# Patient Record
Sex: Female | Born: 1985 | Race: White | Hispanic: No | Marital: Single | Smoking: Current some day smoker
Health system: Southern US, Community
[De-identification: ages and names within clinical notes are randomized; demographics above are authoritative.]

## PROBLEM LIST (undated history)

## (undated) DIAGNOSIS — R87629 Unspecified abnormal cytological findings in specimens from vagina: Secondary | ICD-10-CM

## (undated) DIAGNOSIS — N96 Recurrent pregnancy loss: Secondary | ICD-10-CM

## (undated) HISTORY — PX: CHOLECYSTECTOMY: SHX55

---

## 2007-06-30 ENCOUNTER — Ambulatory Visit (HOSPITAL_COMMUNITY): Admission: RE | Admit: 2007-06-30 | Discharge: 2007-06-30 | Payer: Self-pay | Admitting: Obstetrics and Gynecology

## 2015-04-22 ENCOUNTER — Emergency Department (HOSPITAL_COMMUNITY)
Admission: EM | Admit: 2015-04-22 | Discharge: 2015-04-22 | Disposition: A | Discharge status: Home / Self Care | Payer: Medicaid Other | Attending: Emergency Medicine | Admitting: Emergency Medicine

## 2015-04-22 ENCOUNTER — Encounter (HOSPITAL_COMMUNITY): Payer: Self-pay

## 2015-04-22 DIAGNOSIS — M791 Myalgia: Secondary | ICD-10-CM | POA: Diagnosis not present

## 2015-04-22 DIAGNOSIS — H9209 Otalgia, unspecified ear: Secondary | ICD-10-CM | POA: Insufficient documentation

## 2015-04-22 DIAGNOSIS — Z87891 Personal history of nicotine dependence: Secondary | ICD-10-CM | POA: Diagnosis not present

## 2015-04-22 DIAGNOSIS — R197 Diarrhea, unspecified: Secondary | ICD-10-CM | POA: Insufficient documentation

## 2015-04-22 DIAGNOSIS — Z880 Allergy status to penicillin: Secondary | ICD-10-CM | POA: Diagnosis not present

## 2015-04-22 DIAGNOSIS — J069 Acute upper respiratory infection, unspecified: Secondary | ICD-10-CM | POA: Diagnosis not present

## 2015-04-22 DIAGNOSIS — R05 Cough: Secondary | ICD-10-CM | POA: Diagnosis present

## 2015-04-22 LAB — RAPID STREP SCREEN (MED CTR MEBANE ONLY): STREPTOCOCCUS, GROUP A SCREEN (DIRECT): NEGATIVE

## 2015-04-22 MED ORDER — HYDROCOD POLST-CPM POLST ER 10-8 MG/5ML PO SUER: 5.0000 mL | Freq: Two times a day (BID) | ORAL | Status: DC | PRN

## 2015-04-22 NOTE — ED Notes (Signed)
Pt seen and evaluated by EDNP for initial assessment, RN in room.

## 2015-04-22 NOTE — ED Notes (Signed)
Fever for 3 days. I have a sore throat, congestion, and my ears hurt. Diarrhea today. Vomited yesterday. Was seen in there ER at [place] last night and diagnosed with an upper respiratory infection.

## 2015-04-22 NOTE — Discharge Instructions (Signed)
Your strep screen is negative. We are treating your cough and congestion. Do not take the cough medication if you are driving as it will make you sleepy. You may continue to take the over the counter medication for congestion.

## 2015-04-22 NOTE — ED Provider Notes (Signed)
CSN: [id number]     Arrival date & time 04/22/15  2014 History   First MD Initiated Contact with Patient 04/22/15 2026     Chief Complaint  Patient presents with  . URI     (Consider location/radiation/quality/duration/timing/severity/associated sxs/prior Treatment) Patient is a 29 y.o. female presenting with URI. The history is provided by the patient.  URI Presenting symptoms: congestion, cough, ear pain, fever and sore throat   Severity:  Moderate Onset quality:  Gradual Duration:  3 days Timing:  Constant Progression:  Worsening Chronicity:  New Relieved by:  Nothing Worsened by:  Nothing tried Ineffective treatments:  OTC medications Associated symptoms: headaches, myalgias and swollen glands    Cathy Peters is a 29 y.o. female who presents to the ED with fever, sore throat, congestion and ear pain x 3 days. She reports going to [facility] ED in [place] last night and diagnosed with URI. She had a chest x-ray that was normal and prescribed Tessalon for cough. Patient states not helping. Today she has coughed until she vomits. She also reports loose stools x 3.  History reviewed. No pertinent past medical history. Past Surgical History  Procedure Laterality Date  . Cholecystectomy     No family history on file. Social History  Substance Use Topics  . Smoking status: Former Games developer  . Smokeless tobacco: None  . Alcohol Use: No   OB History    No data available     Review of Systems  Constitutional: Positive for fever.  HENT: Positive for congestion, ear pain and sore throat.   Respiratory: Positive for cough.   Gastrointestinal: Vomiting: with cough.  Musculoskeletal: Positive for myalgias.  Neurological: Positive for headaches.  all other systems negative    Allergies  Penicillins  Home Medications   Prior to Admission medications   Not on File   BP 117/48 mmHg  Pulse 87  Temp(Src) 98.4 F (36.9 C) (Oral)  Resp 20  Ht  (1.651 m)  Wt 155  lb (70.308 kg)  BMI 25.79 kg/m2  SpO2 100% Physical Exam  Constitutional: She is oriented to person, place, and time. She appears well-developed and well-nourished.  HENT:  Head: Normocephalic and atraumatic.  Right Ear: Tympanic membrane normal.  Left Ear: Tympanic membrane normal.  Nose: Rhinorrhea present.  Mouth/Throat: Uvula is midline and mucous membranes are normal. Posterior oropharyngeal erythema present.  Eyes: Conjunctivae and EOM are normal. Pupils are equal, round, and reactive to light.  Neck: Neck supple.  Cardiovascular: Normal rate and regular rhythm.   Pulmonary/Chest: Effort normal and breath sounds normal. No respiratory distress.  Abdominal: Soft. There is no tenderness.  Musculoskeletal: Normal range of motion.  Neurological: She is alert and oriented to person, place, and time. No cranial nerve deficit.  Skin: Skin is warm and dry.  Psychiatric: She has a normal mood and affect. Her behavior is normal.  Nursing note and vitals reviewed.   ED Course  Procedures   MDM  29 y.o. female with cough, congestion, sore throat and aching all over and diarrhea today. She will continue to take her congestion medication and I will add Tussionex for the cough. She will follow up with her PCP. Discussed with the patient clinical and lab findings and plan of care. All questioned fully answered. She will return if any problems arise.stable for d/c without fever and does not appear toxic.    Final diagnoses:  URI (upper respiratory infection)  Diarrhea, unspecified type  [place], [place] 04/22/15 2125  Marily Memos, MD 04/23/15 [phone]

## 2015-04-26 LAB — CULTURE, GROUP A STREP: STREP A CULTURE: NEGATIVE

## 2017-11-15 ENCOUNTER — Other Ambulatory Visit (HOSPITAL_COMMUNITY): Payer: Self-pay | Admitting: Nurse Practitioner

## 2017-11-15 DIAGNOSIS — O289 Unspecified abnormal findings on antenatal screening of mother: Secondary | ICD-10-CM

## 2017-12-01 ENCOUNTER — Encounter (HOSPITAL_COMMUNITY): Payer: Self-pay

## 2017-12-01 ENCOUNTER — Ambulatory Visit (HOSPITAL_COMMUNITY): Payer: Medicaid Other

## 2017-12-13 ENCOUNTER — Encounter (HOSPITAL_COMMUNITY): Payer: Self-pay | Admitting: *Deleted

## 2017-12-15 ENCOUNTER — Other Ambulatory Visit (HOSPITAL_COMMUNITY): Payer: Self-pay | Admitting: Nurse Practitioner

## 2017-12-15 ENCOUNTER — Ambulatory Visit (HOSPITAL_COMMUNITY)
Admission: RE | Admit: 2017-12-15 | Discharge: 2017-12-15 | Disposition: A | Discharge status: Home / Self Care | Payer: Medicaid Other | Source: Ambulatory Visit | Attending: Nurse Practitioner | Admitting: Nurse Practitioner

## 2017-12-15 ENCOUNTER — Encounter (HOSPITAL_COMMUNITY): Payer: Self-pay

## 2017-12-15 ENCOUNTER — Other Ambulatory Visit: Payer: Self-pay

## 2017-12-15 DIAGNOSIS — O280 Abnormal hematological finding on antenatal screening of mother: Secondary | ICD-10-CM | POA: Insufficient documentation

## 2017-12-15 DIAGNOSIS — Z3686 Encounter for antenatal screening for cervical length: Secondary | ICD-10-CM

## 2017-12-15 DIAGNOSIS — Z3A22 22 weeks gestation of pregnancy: Secondary | ICD-10-CM | POA: Diagnosis not present

## 2017-12-15 DIAGNOSIS — O09212 Supervision of pregnancy with history of pre-term labor, second trimester: Secondary | ICD-10-CM

## 2017-12-15 DIAGNOSIS — Z363 Encounter for antenatal screening for malformations: Secondary | ICD-10-CM

## 2017-12-15 DIAGNOSIS — O289 Unspecified abnormal findings on antenatal screening of mother: Secondary | ICD-10-CM

## 2017-12-15 DIAGNOSIS — Z368A Encounter for antenatal screening for other genetic defects: Secondary | ICD-10-CM | POA: Insufficient documentation

## 2017-12-15 HISTORY — DX: Recurrent pregnancy loss: N96

## 2017-12-15 HISTORY — DX: Unspecified abnormal cytological findings in specimens from vagina: R87.629

## 2017-12-15 NOTE — Consult Note (Signed)
Maternal Fetal Medicine Consultation  Requesting Provider(s): Arlyn Leak  Primary OB: Arlyn Leak Reason for consultation: 1. Elevated MSAFP at 2.55 MoM 2. History of PTD  HPI: 32 yo P0202 at 22+0 weeks with elevated MSAFP as noted above. She has undergone [place] and there is no evidence of open neural tube defect or ventral wall defect. She has undergone genetic counseling and does not wish to undergo amniocentesis for acetylcholinesterase testing to rule out very small open NTD. She has had 2 previous PTD; the first was a spontaneous PTD at 32 weeks in 2008, baby weight 1814g. The second was possible a 36 week PTD but may have been term, in 2009 She was on 17-hydroxyprogesterone during that pregnancy and the baby weight 2722g. She is currently on 17-hydroxyprogesterone weekly. She reports no PTL symptoms  OB History: OB History    Gravida  6   Para  2   Term  0   Preterm  2   AB  3   Living  2     SAB  3   TAB      Ectopic      Multiple      Live Births              PMH:  Past Medical History:  Diagnosis Date  . Recurrent pregnancy loss   . Vaginal Pap smear, abnormal     PSH:  Past Surgical History:  Procedure Laterality Date  . CHOLECYSTECTOMY     Meds: Makena, PNV Allergies: Penicillin FH: See EPIC section Soc: See EPIC section  Review of Systems: no vaginal bleeding or cramping/contractions, no LOF, no nausea/vomiting. All other systems reviewed and are negative.  PE:  VS: See EPIC section GEN: well-appearing female ABD: gravid, NT  Please see separate document for fetal ultrasound report.  A/P: 1. Isolated elevated MSAFP: the patient has a slightly increased risk for IUGR due to placental dysfunction that might be responsible for her elevated MSAFP. I have asked her to return for a growth evaluation at 28 weeks, and if normal, a repeat evaluation at 34-35 weeks. At this time no other interventions are warranted 2. History of PTD: She is already  receiving appropriate treatment for her previous PTD. Cervical evaluation is normal. I have asked her to return in 2 weeks for a transvaginal cervical length at 24 weeks. If normal, no further TVCL evaluations are needed.  Thank you for the opportunity to be a part of the care of Cathy Peters. Please contact our office if we can be of further assistance.   I spent approximately 30 minutes with this patient with over 50% of time spent in face-to-face counseling.

## 2017-12-15 NOTE — Progress Notes (Signed)
Genetic Counseling  High-Risk Gestation Note  Appointment Date:  12/15/2017 Referred By: Orbie Hurst, NP Date of Birth:  [birth year]   Pregnancy History: [id number] Estimated Date of Delivery: 04/20/18 Estimated Gestational Age: [redacted]w[redacted]d Attending: Charlsie Merles, MD    Ms. Cathy Peters was seen for consultation for genetic counseling because of an elevated MSAFP of 2.55 MoMs based on maternal serum screening through [employer].    In summary:  Discussed elevated MSAFP   Reviewed possible explanations for elevation  Discussed additional options  Ultrasound- performed today; see separate report  Amniocentesis- declined  Discussed associations with unexplained elevated MSAFP  Follow-up ultrasound scheduled to reassess fetal growth  Reviewed family history concerns  Discussed carrier screening options- declined today  CF  SMA  Hemoglobinopathies  We reviewed Ms. Koudelka's maternal serum screening result, the elevation of MSAFP, and the associated 1 in 262 risk for a fetal open neural tube defect.   We reviewed open neural tube defects including: the typical multifactorial etiology and variable prognosis.  In addition, we discussed alternative explanations for an elevated MSAFP including: normal variation, twins, feto-maternal bleeding, a gestational dating error, abdominal wall defects, kidney differences, oligohydramnios, and placental problems.  We discussed that an unexplained elevation of MSAFP is associated with an increased risk for third trimester complications including: prematurity, low birth weight, and pre-eclampsia.    We reviewed additional available screening and diagnostic options including detailed ultrasound and amniocentesis.  We discussed the risks, limitations, and benefits of each.  After thoughtful consideration of these options, Ms. Clewis elected to have ultrasound, but declined amniocentesis.  She understands that ultrasound cannot rule out all birth  defects or genetic syndromes.  However, she was counseled that ~90% of fetuses with open neural tube defects can be detected by detailed second trimester ultrasound, when well visualized.  A complete ultrasound was performed today.  The ultrasound report will be sent under separate cover. Ms. Coxe was also seen for MD consultation today; see separate MFM consult note for detailed discussion.   Ms. THALYA FOUCHE was provided with written information regarding cystic fibrosis (CF), spinal muscular atrophy (SMA) and hemoglobinopathies including the carrier frequency, availability of carrier screening and prenatal diagnosis if indicated.  In addition, we discussed that CF and hemoglobinopathies are routinely screened for as part of the [place] newborn screening panel.  After further discussion, she declined screening for CF, SMA and hemoglobinopathies.  Both family histories were reviewed and found to be noncontributory for birth defects, intellectual disability, and known genetic conditions.  Ms. Mckiver reported that her three previous pregnancies with the father of the pregnancy resulted in early first trimester loss, and she reported that environmental causes may have played a role during that time. Without further information regarding the provided family history, an accurate genetic risk cannot be calculated. Further genetic counseling is warranted if more information is obtained. The father of the pregnancy is [place] American, and Ms. Riffe is Caucasian. The couple has no known consanguinity.   Ms. Huot denied exposure to environmental toxins or chemical agents. She denied the use of alcohol or street drugs. She reported smoking a half pack of cigarettes per day. Associations of smoking in pregnancy were reviewed and cessation encouraged. She denied significant viral illnesses during the course of her pregnancy.   I counseled Ms. Truddie Crumble Moten for approximately 20 minutes regarding the above risks and available  options.    Quinn Plowman, MS,  Certified Genetic Counselor 12/15/2017

## 2017-12-16 ENCOUNTER — Other Ambulatory Visit (HOSPITAL_COMMUNITY): Payer: Self-pay | Admitting: *Deleted

## 2017-12-16 DIAGNOSIS — O09899 Supervision of other high risk pregnancies, unspecified trimester: Secondary | ICD-10-CM

## 2017-12-16 DIAGNOSIS — O09219 Supervision of pregnancy with history of pre-term labor, unspecified trimester: Principal | ICD-10-CM

## 2017-12-16 DIAGNOSIS — O280 Abnormal hematological finding on antenatal screening of mother: Secondary | ICD-10-CM

## 2018-01-03 ENCOUNTER — Ambulatory Visit (HOSPITAL_COMMUNITY)
Admission: RE | Admit: 2018-01-03 | Discharge: 2018-01-03 | Disposition: A | Discharge status: Home / Self Care | Payer: Medicaid Other | Source: Ambulatory Visit | Attending: Nurse Practitioner | Admitting: Nurse Practitioner

## 2018-01-03 ENCOUNTER — Encounter (HOSPITAL_COMMUNITY): Payer: Self-pay

## 2018-01-26 ENCOUNTER — Ambulatory Visit (HOSPITAL_COMMUNITY)
Admission: RE | Admit: 2018-01-26 | Discharge: 2018-01-26 | Disposition: A | Discharge status: Home / Self Care | Payer: Medicaid Other | Source: Ambulatory Visit | Attending: Nurse Practitioner | Admitting: Nurse Practitioner

## 2018-07-13 ENCOUNTER — Encounter (HOSPITAL_COMMUNITY): Payer: Self-pay

## 2019-03-19 ENCOUNTER — Encounter (HOSPITAL_COMMUNITY): Payer: Self-pay | Admitting: Emergency Medicine

## 2019-03-19 ENCOUNTER — Other Ambulatory Visit: Payer: Self-pay

## 2019-03-19 ENCOUNTER — Emergency Department (HOSPITAL_COMMUNITY)
Admission: EM | Admit: 2019-03-19 | Discharge: 2019-03-20 | Disposition: A | Discharge status: Home / Self Care | Payer: Medicaid Other | Attending: Emergency Medicine | Admitting: Emergency Medicine

## 2019-03-19 DIAGNOSIS — R102 Pelvic and perineal pain: Secondary | ICD-10-CM

## 2019-03-19 DIAGNOSIS — Z20828 Contact with and (suspected) exposure to other viral communicable diseases: Secondary | ICD-10-CM | POA: Insufficient documentation

## 2019-03-19 DIAGNOSIS — O2302 Infections of kidney in pregnancy, second trimester: Secondary | ICD-10-CM | POA: Diagnosis not present

## 2019-03-19 DIAGNOSIS — F17210 Nicotine dependence, cigarettes, uncomplicated: Secondary | ICD-10-CM | POA: Insufficient documentation

## 2019-03-19 DIAGNOSIS — R509 Fever, unspecified: Secondary | ICD-10-CM

## 2019-03-19 DIAGNOSIS — O99334 Smoking (tobacco) complicating childbirth: Secondary | ICD-10-CM | POA: Diagnosis not present

## 2019-03-19 DIAGNOSIS — N12 Tubulo-interstitial nephritis, not specified as acute or chronic: Secondary | ICD-10-CM

## 2019-03-19 DIAGNOSIS — R109 Unspecified abdominal pain: Secondary | ICD-10-CM | POA: Diagnosis present

## 2019-03-19 DIAGNOSIS — Z3A17 17 weeks gestation of pregnancy: Secondary | ICD-10-CM | POA: Diagnosis not present

## 2019-03-19 LAB — CBC
HCT: 32.5 % — ABNORMAL LOW (ref 36.0–46.0)
Hemoglobin: 11 g/dL — ABNORMAL LOW (ref 12.0–15.0)
MCH: 32.3 pg (ref 26.0–34.0)
MCHC: 33.8 g/dL (ref 30.0–36.0)
MCV: 95.3 fL (ref 80.0–100.0)
Platelets: 191 10*3/uL (ref 150–400)
RBC: 3.41 MIL/uL — ABNORMAL LOW (ref 3.87–5.11)
RDW: 13.7 % (ref 11.5–15.5)
WBC: 11 10*3/uL — ABNORMAL HIGH (ref 4.0–10.5)
nRBC: 0 % (ref 0.0–0.2)

## 2019-03-19 LAB — BASIC METABOLIC PANEL
Anion gap: 9 (ref 5–15)
BUN: 10 mg/dL (ref 6–20)
CO2: 22 mmol/L (ref 22–32)
Calcium: 9.4 mg/dL (ref 8.9–10.3)
Chloride: 102 mmol/L (ref 98–111)
Creatinine, Ser: 0.57 mg/dL (ref 0.44–1.00)
GFR calc Af Amer: >60 mL/min (ref >60)
GFR calc non Af Amer: >60 mL/min (ref >60)
Glucose, Bld: 86 mg/dL (ref 70–99)
Potassium: 3.8 mmol/L (ref 3.5–5.1)
Sodium: 133 mmol/L — ABNORMAL LOW (ref 135–145)

## 2019-03-19 LAB — POC URINE PREG, ED: Preg Test, Ur: POSITIVE — AB

## 2019-03-19 LAB — LACTIC ACID, PLASMA: Lactic Acid, Venous: 0.8 mmol/L (ref 0.5–1.9)

## 2019-03-19 MED ORDER — SODIUM CHLORIDE 0.9 % IV BOLUS
1000.0000 mL | Freq: Once | INTRAVENOUS | Status: AC
  Administered 2019-03-19: 1000 mL via INTRAVENOUS

## 2019-03-19 MED ORDER — ACETAMINOPHEN 500 MG PO TABS
1000.0000 mg | ORAL_TABLET | Freq: Once | ORAL | Status: AC
  Administered 2019-03-19: 23:00:00 1000 mg via ORAL
  Filled 2019-03-19: qty 2

## 2019-03-19 NOTE — ED Provider Notes (Addendum)
[facility] EMERGENCY DEPARTMENT Provider Note   CSN: [id number] Arrival date & time: 03/19/19  [id number]     History   Chief Complaint Chief Complaint  Patient presents with  . Flank Pain    HPI Cena Bentonshley D Foor is a 33 y.o. female.     Patient reports right-sided flank pain and lower abdominal pain with dysuria and hematuria for the past 4 days.  States she has burning with urination as well as bright red blood with clots in her urine and her urine is cloudy.  She has urgency, frequency and dysuria.  She denies any vaginal bleeding or discharge.  She believes she is about [redacted] weeks pregnant with her sixth pregnancy.  She has 3 living children.  She reports having an ultrasound with this pregnancy about about 6 weeks that showed the baby was intrauterine.  She follows with Dr. Mora ApplMcLeod in [place].  She has had nausea but no vomiting.  No diarrhea.  No cough or congestion or rhinorrhea.  She was found to be febrile on arrival did not know she had a fever at home.  She reports pain to her right side of her abdomen and right flank that radiates to her groin.  There is pain with urination or blood in the urine.  Previous cholecystectomy.  No chest pain or shortness of breath.  No known sick contacts.  No history of kidney stones.  The history is provided by the patient.  Flank Pain Associated symptoms include abdominal pain. Pertinent negatives include no chest pain, no headaches and no shortness of breath.    Past Medical History:  Diagnosis Date  . Recurrent pregnancy loss   . Vaginal Pap smear, abnormal     Patient Active Problem List   Diagnosis Date Noted  . Abnormal MSAFP (maternal serum alpha-fetoprotein), elevated 12/15/2017  . [redacted] weeks gestation of pregnancy     Past Surgical History:  Procedure Laterality Date  . CHOLECYSTECTOMY       OB History    Gravida  7   Para  2   Term  0   Preterm  2   AB  3   Living  2     SAB  3   TAB      Ectopic      Multiple       Live Births               Home Medications    Prior to Admission medications   Medication Sig Start Date End Date Taking? Authorizing Provider  ibuprofen (ADVIL) 200 MG tablet Take 200 mg by mouth every 6 (six) hours as needed for mild pain or moderate pain.   Yes [provider]    Family History Family History  Problem Relation Age of Onset  . Diabetes Mother   . Hypertension Mother   . Thyroid disease Mother   . Hypertension Father     Social History Social History   Tobacco Use  . Smoking status: Current Some Day Smoker    Packs/day: 0.50  . Smokeless tobacco: Never Used  Substance Use Topics  . Alcohol use: No  . Drug use: No     Allergies   Penicillins   Review of Systems Review of Systems  Constitutional: Positive for activity change, appetite change, chills, fatigue and fever.  HENT: Negative for rhinorrhea.   Eyes: Negative for visual disturbance.  Respiratory: Negative for cough, chest tightness and shortness of breath.   Cardiovascular: Negative  for chest pain.  Gastrointestinal: Positive for abdominal pain and nausea. Negative for diarrhea and vomiting.  Genitourinary: Positive for dysuria, flank pain, hematuria, pelvic pain and urgency. Negative for vaginal bleeding and vaginal discharge.  Musculoskeletal: Positive for back pain.  Neurological: Negative for dizziness, weakness and headaches.    all other systems are negative except as noted in the HPI and PMH.    Physical Exam Updated Vital Signs BP 99/66   Pulse (!) 102   Temp (!) 101.9 F (38.8 C) (Oral)   Resp 18   SpO2 98%   Physical Exam Vitals signs and nursing note reviewed.  Constitutional:      General: She is not in acute distress.    Appearance: She is well-developed. She is ill-appearing.     Comments: Febrile  HENT:     Head: Normocephalic and atraumatic.     Mouth/Throat:     Pharynx: No oropharyngeal exudate.  Eyes:     Conjunctiva/sclera: Conjunctivae  normal.     Pupils: Pupils are equal, round, and reactive to light.  Neck:     Musculoskeletal: Normal range of motion and neck supple.     Comments: No meningismus. Cardiovascular:     Rate and Rhythm: Regular rhythm. Tachycardia present.     Heart sounds: Normal heart sounds. No murmur.  Pulmonary:     Effort: Pulmonary effort is normal. No respiratory distress.     Breath sounds: Normal breath sounds.  Abdominal:     Palpations: Abdomen is soft.     Tenderness: There is abdominal tenderness. There is no guarding or rebound.     Comments: Gravid abdomen, mild right upper quadrant and right lower quadrant tenderness without guarding or rebound.  Genitourinary:    Comments: Chaperone present.  Normal external genitalia.  There is white discharge in the vaginal vault.  There is no CMT or lateralizing adnexal tenderness. Musculoskeletal: Normal range of motion.        General: Tenderness present.     Right lower leg: No edema.     Comments: Right CVA tenderness  Skin:    General: Skin is warm.     Capillary Refill: Capillary refill takes less than 2 seconds.  Neurological:     General: No focal deficit present.     Mental Status: She is alert and oriented to person, place, and time. Mental status is at baseline.     Cranial Nerves: No cranial nerve deficit.     Motor: No abnormal muscle tone.     Coordination: Coordination normal.     Comments: No ataxia on finger to nose bilaterally. No pronator drift. 5/5 strength throughout. CN 2-12 intact.Equal grip strength. Sensation intact.   Psychiatric:        Behavior: Behavior normal.      ED Treatments / Results  Labs (all labs ordered are listed, but only abnormal results are displayed) Labs Reviewed  WET PREP, GENITAL - Abnormal; Notable for the following components:      Result Value   WBC, Wet Prep HPF POC MANY (*)    All other components within normal limits  URINALYSIS, ROUTINE W REFLEX MICROSCOPIC - Abnormal; Notable for  the following components:   APPearance CLOUDY (*)    Hgb urine dipstick MODERATE (*)    Ketones, ur 80 (*)    Protein, ur 100 (*)    Nitrite POSITIVE (*)    Leukocytes,Ua LARGE (*)    RBC / HPF >50 (*)    WBC,  UA >50 (*)    All other components within normal limits  BASIC METABOLIC PANEL - Abnormal; Notable for the following components:   Sodium 133 (*)    All other components within normal limits  CBC - Abnormal; Notable for the following components:   WBC 11.0 (*)    RBC 3.41 (*)    Hemoglobin 11.0 (*)    HCT 32.5 (*)    All other components within normal limits  HCG, QUANTITATIVE, PREGNANCY - Abnormal; Notable for the following components:   hCG, Beta Chain, Quant, S 16,048 (*)    All other components within normal limits  HEPATIC FUNCTION PANEL - Abnormal; Notable for the following components:   Albumin 3.0 (*)    AST 70 (*)    ALT 82 (*)    Bilirubin, Direct 0.3 (*)    All other components within normal limits  POC URINE PREG, ED - Abnormal; Notable for the following components:   Preg Test, Ur POSITIVE (*)    All other components within normal limits  SARS CORONAVIRUS 2 (HOSPITAL ORDER, [place] [facility])  CULTURE, BLOOD (ROUTINE X 2)  CULTURE, BLOOD (ROUTINE X 2)  URINE CULTURE  LACTIC ACID, PLASMA  LACTIC ACID, PLASMA  LIPASE, BLOOD  ABO/RH  GC/CHLAMYDIA PROBE AMP ([place]) NOT AT [facility]    EKG None  Radiology US Ob Limited  Result Date: 03/20/2019 CLINICAL DATA:  Pregnant patient in second trimester pregnancy with pain for 3 days. EXAM: LIMITED OBSTETRIC ULTRASOUND COMPARISON:  Obstetric ultrasound 01/23/2019 FINDINGS: Number of Fetuses: 1 Heart Rate:  145 bpm Movement: Yes Presentation: Cephalic Placental Location: Posterior. Anechoic areas in the placenta, largest measuring up to 3 cm. Previa: No Amniotic Fluid (Subjective):  Within normal limits. BPD: 3.57 cm 17 w  0 d MATERNAL FINDINGS: Cervix:  Appears closed. Uterus/Adnexae: No  abnormality visualized. IMPRESSION: 1. Single live intrauterine pregnancy estimated gestational age [redacted] weeks 0 days based on biparietal diameter. 2. Posterior placenta with possible venous lakes. Recommend attention on follow-up. This exam is performed on an emergent basis and does not comprehensively evaluate fetal size, dating, or anatomy; follow-up complete OB US should be considered if further fetal assessment is warranted. Electronically Signed   By: Keith Rake M.D.   On: 03/20/2019 02:03   US Abdomen Limited Ruq  Result Date: 03/20/2019 CLINICAL DATA:  Abdominal pain. Pregnant patient in second trimester pregnancy. EXAM: ULTRASOUND ABDOMEN LIMITED RIGHT UPPER QUADRANT COMPARISON:  None. FINDINGS: Gallbladder: Surgically absent. Common bile duct: Diameter: 2 mm proximally, 5 mm distally, normal for postcholecystectomy status. Liver: No focal lesion identified. Mildly increased in parenchymal echogenicity. Portal vein is patent on color Doppler imaging with normal direction of blood flow towards the liver. Other: None. IMPRESSION: 1. Postcholecystectomy without biliary dilatation. 2. Mild hepatic steatosis. Electronically Signed   By: Keith Rake M.D.   On: 03/20/2019 02:10    Procedures Procedures (including critical care time)  Medications Ordered in ED Medications  sodium chloride 0.9 % bolus 1,000 mL (has no administration in time range)  acetaminophen (TYLENOL) tablet 1,000 mg (1,000 mg Oral Given 03/19/19 2317)     Initial Impression / Assessment and Plan / ED Course  I have reviewed the triage vital signs and the nursing notes.  Pertinent labs & imaging results that were available during my care of the patient were reviewed by me and considered in my medical decision making (see chart for details).       Patient at  [redacted] weeks gestation presenting with right-sided flank pain, dysuria, hematuria and fever.  Chart review shows that she had a ultrasound done on July 8 when  she was 8 weeks at that time.  Patient here with suspected pyelonephritis.  She is known to have an IUP by report.  She is febrile.  She is started on IV fluids and IV antibiotics.  Ultrasound will be obtained to rule out kidney stone  Urinalysis concerning for infection.  Blood and urine culture sent.  Patient given IV fluids as well as IV antibiotics.  Ultrasound shows confirmed IUP at 17 weeks.  Fetal heart rate 145. Renal ultrasound shows no hydronephrosis or evidence of ureteral calculus. RUQ US normal s/p cholecystectomy.  D/[facility] OB [facility].  He agrees the patient is tolerating p.o. and not vomiting her pain is controlled she can be discharged with outpatient treatment and follow-up tomorrow.  Blood pressure has improved to 101 systolic.  Lactate is negative.  No vomiting throughout ED course.  Pain is controlled and she is tolerating p.o. Coronavirus testing is negative.  Patient does want to go home.  She agrees to take antibiotics as prescribed and follow-up with her OB doctor in the morning. Return precautions discussed including worsening pain, persistent vomiting, not able to urinate, any other concerns.  BP 101/64   Pulse 87   Temp 99.9 F (37.7 C) (Oral)   Resp 16   SpO2 99%     Cena Bentonshley D Bogie was evaluated in Emergency Department on 03/20/2019 for the symptoms described in the history of present illness. She was evaluated in the context of the global COVID-19 pandemic, which necessitated consideration that the patient might be at risk for infection with the SARS-CoV-2 virus that causes COVID-19. Institutional protocols and algorithms that pertain to the evaluation of patients at risk for COVID-19 are in a state of rapid change based on information released by regulatory bodies including the CDC and federal and state organizations. These policies and algorithms were followed during the patient's care in the ED.   CRITICAL CARE Performed by: Glynn OctaveStephen  Yazhini Mcaulay Total critical care time: 35 minutes Critical care time was exclusive of separately billable procedures and treating other patients. Critical care was necessary to treat or prevent imminent or life-threatening deterioration. Critical care was time spent personally by me on the following activities: development of treatment plan with patient and/or surrogate as well as nursing, discussions with consultants, evaluation of patient's response to treatment, examination of patient, obtaining history from patient or surrogate, ordering and performing treatments and interventions, ordering and review of laboratory studies, ordering and review of radiographic studies, pulse oximetry and re-evaluation of patient's condition.  Final Clinical Impressions(s) / ED Diagnoses   Final diagnoses:  Fever  Pyelonephritis    ED Discharge Orders    None       Deyna Carbon, Jeannett SeniorStephen, MD 03/20/19 0340    Glynn Octaveancour, Avanna Sowder, MD 03/20/19 [phone]

## 2019-03-19 NOTE — ED Triage Notes (Signed)
Pt c/o bilateral flank/pelvic pain since Friday with hematuria. Pt states she thinks she is [redacted] weeks pregnant, but has not seen obgyn in 6 weeks.

## 2019-03-20 ENCOUNTER — Emergency Department (HOSPITAL_COMMUNITY): Payer: Medicaid Other

## 2019-03-20 ENCOUNTER — Telehealth (HOSPITAL_BASED_OUTPATIENT_CLINIC_OR_DEPARTMENT_OTHER): Payer: Self-pay | Admitting: Emergency Medicine

## 2019-03-20 LAB — WET PREP, GENITAL
Clue Cells Wet Prep HPF POC: NONE SEEN
Sperm: NONE SEEN
Trich, Wet Prep: NONE SEEN
Yeast Wet Prep HPF POC: NONE SEEN

## 2019-03-20 LAB — URINALYSIS, ROUTINE W REFLEX MICROSCOPIC
Bacteria, UA: NONE SEEN
Bilirubin Urine: NEGATIVE
Glucose, UA: NEGATIVE mg/dL
Ketones, ur: 80 mg/dL — AB
Nitrite: POSITIVE — AB
Protein, ur: 100 mg/dL — AB
RBC / HPF: >50 RBC/hpf — ABNORMAL HIGH (ref 0–5)
Specific Gravity, Urine: 1.014 (ref 1.005–1.030)
WBC, UA: >50 WBC/hpf — ABNORMAL HIGH (ref 0–5)
pH: 6 (ref 5.0–8.0)

## 2019-03-20 LAB — HEPATIC FUNCTION PANEL
ALT: 82 U/L — ABNORMAL HIGH (ref 0–44)
AST: 70 U/L — ABNORMAL HIGH (ref 15–41)
Albumin: 3 g/dL — ABNORMAL LOW (ref 3.5–5.0)
Alkaline Phosphatase: 64 U/L (ref 38–126)
Bilirubin, Direct: 0.3 mg/dL — ABNORMAL HIGH (ref 0.0–0.2)
Indirect Bilirubin: 0.4 mg/dL (ref 0.3–0.9)
Total Bilirubin: 0.7 mg/dL (ref 0.3–1.2)
Total Protein: 6.8 g/dL (ref 6.5–8.1)

## 2019-03-20 LAB — BLOOD CULTURE ID PANEL (REFLEXED)

## 2019-03-20 LAB — LACTIC ACID, PLASMA: Lactic Acid, Venous: 0.7 mmol/L (ref 0.5–1.9)

## 2019-03-20 LAB — SARS CORONAVIRUS 2 BY RT PCR (HOSPITAL ORDER, PERFORMED IN ~~LOC~~ HOSPITAL LAB): SARS Coronavirus 2: NEGATIVE

## 2019-03-20 LAB — LIPASE, BLOOD: Lipase: 33 U/L (ref 11–51)

## 2019-03-20 LAB — HCG, QUANTITATIVE, PREGNANCY: hCG, Beta Chain, Quant, S: 16048 m[IU]/mL — ABNORMAL HIGH (ref <5)

## 2019-03-20 LAB — ABO/RH: ABO/RH(D): A POS

## 2019-03-20 MED ORDER — MORPHINE SULFATE (PF) 4 MG/ML IV SOLN
4.0000 mg | Freq: Once | INTRAVENOUS | Status: AC
  Administered 2019-03-20: 03:00:00 4 mg via INTRAVENOUS
  Filled 2019-03-20: qty 1

## 2019-03-20 MED ORDER — SODIUM CHLORIDE 0.9 % IV BOLUS
1000.0000 mL | Freq: Once | INTRAVENOUS | Status: AC
  Administered 2019-03-20: 1000 mL via INTRAVENOUS

## 2019-03-20 MED ORDER — CEPHALEXIN 500 MG PO CAPS: 500.0000 mg | ORAL_CAPSULE | Freq: Four times a day (QID) | ORAL | 0 refills | Status: AC

## 2019-03-20 MED ORDER — ONDANSETRON 4 MG PO TBDP: 4.0000 mg | ORAL_TABLET | Freq: Three times a day (TID) | ORAL | 0 refills | Status: AC | PRN

## 2019-03-20 MED ORDER — SODIUM CHLORIDE 0.9 % IV SOLN
1.0000 g | Freq: Once | INTRAVENOUS | Status: AC
  Administered 2019-03-20: 1 g via INTRAVENOUS
  Filled 2019-03-20: qty 10

## 2019-03-20 NOTE — ED Notes (Signed)
Pt given sprite to drink. 

## 2019-03-20 NOTE — Discharge Instructions (Signed)
You have a severe kidney infection.  There is no kidney stone.  Take the antibiotics as prescribed.  Follow-up with your [facility] doctor tomorrow.  Return to the ED if you are not able to eat or drink, high persistent vomiting, worsening pain, inability to urinate or any other concerns.

## 2019-03-20 NOTE — ED Notes (Signed)
Spoke with [facility] Dr. Lysbeth Penner paged for Dr. Wyvonnia Dusky per operator.

## 2019-03-21 LAB — GC/CHLAMYDIA PROBE AMP (~~LOC~~) NOT AT ARMC
Chlamydia: NEGATIVE
Neisseria Gonorrhea: NEGATIVE

## 2019-03-21 NOTE — ED Notes (Signed)
03/21/2019 0802  Spoke with patient per Dr. Vevelyn Francois request. Pt is going to go to [facility] ED because she is followed by Dr. Evie Lacks in [place].

## 2019-03-22 LAB — URINE CULTURE: Culture: >=100000 — AB

## 2019-03-22 LAB — CULTURE, BLOOD (ROUTINE X 2)
Special Requests: ADEQUATE
Special Requests: ADEQUATE

## 2020-04-07 ENCOUNTER — Other Ambulatory Visit: Payer: Medicaid Other

## 2020-04-07 ENCOUNTER — Other Ambulatory Visit: Payer: Self-pay | Admitting: Critical Care Medicine

## 2020-04-07 DIAGNOSIS — Z20822 Contact with and (suspected) exposure to covid-19: Secondary | ICD-10-CM

## 2020-04-09 LAB — SARS-COV-2, NAA 2 DAY TAT

## 2020-04-09 LAB — SPECIMEN STATUS REPORT

## 2020-04-09 LAB — NOVEL CORONAVIRUS, NAA: SARS-CoV-2, NAA: NOT DETECTED

## 2021-05-31 ENCOUNTER — Emergency Department (HOSPITAL_COMMUNITY): Payer: Medicaid Other

## 2021-05-31 ENCOUNTER — Other Ambulatory Visit: Payer: Self-pay

## 2021-05-31 ENCOUNTER — Emergency Department (HOSPITAL_COMMUNITY)
Admission: EM | Admit: 2021-05-31 | Discharge: 2021-05-31 | Disposition: A | Discharge status: Home / Self Care | Payer: Medicaid Other | Attending: Emergency Medicine | Admitting: Emergency Medicine

## 2021-05-31 DIAGNOSIS — F17210 Nicotine dependence, cigarettes, uncomplicated: Secondary | ICD-10-CM | POA: Insufficient documentation

## 2021-05-31 DIAGNOSIS — S299XXA Unspecified injury of thorax, initial encounter: Secondary | ICD-10-CM | POA: Diagnosis present

## 2021-05-31 DIAGNOSIS — R519 Headache, unspecified: Secondary | ICD-10-CM | POA: Diagnosis not present

## 2021-05-31 DIAGNOSIS — Y92410 Unspecified street and highway as the place of occurrence of the external cause: Secondary | ICD-10-CM | POA: Insufficient documentation

## 2021-05-31 DIAGNOSIS — S20312A Abrasion of left front wall of thorax, initial encounter: Secondary | ICD-10-CM | POA: Diagnosis not present

## 2021-05-31 DIAGNOSIS — M79671 Pain in right foot: Secondary | ICD-10-CM | POA: Diagnosis not present

## 2021-05-31 DIAGNOSIS — M542 Cervicalgia: Secondary | ICD-10-CM | POA: Diagnosis not present

## 2021-05-31 DIAGNOSIS — T1490XA Injury, unspecified, initial encounter: Secondary | ICD-10-CM

## 2021-05-31 LAB — URINALYSIS, ROUTINE W REFLEX MICROSCOPIC
Bilirubin Urine: NEGATIVE
Glucose, UA: NEGATIVE mg/dL
Ketones, ur: NEGATIVE mg/dL
Nitrite: NEGATIVE
Protein, ur: NEGATIVE mg/dL
Specific Gravity, Urine: 1.011 (ref 1.005–1.030)
pH: 6 (ref 5.0–8.0)

## 2021-05-31 LAB — COMPREHENSIVE METABOLIC PANEL
ALT: 22 U/L (ref 0–44)
AST: 38 U/L (ref 15–41)
Albumin: 4.1 g/dL (ref 3.5–5.0)
Alkaline Phosphatase: 37 U/L — ABNORMAL LOW (ref 38–126)
Anion gap: 14 (ref 5–15)
BUN: 11 mg/dL (ref 6–20)
CO2: 18 mmol/L — ABNORMAL LOW (ref 22–32)
Calcium: 9.4 mg/dL (ref 8.9–10.3)
Chloride: 104 mmol/L (ref 98–111)
Creatinine, Ser: 0.58 mg/dL (ref 0.44–1.00)
GFR, Estimated: >60 mL/min (ref >60)
Glucose, Bld: 91 mg/dL (ref 70–99)
Potassium: 4.7 mmol/L (ref 3.5–5.1)
Sodium: 136 mmol/L (ref 135–145)
Total Bilirubin: 1.8 mg/dL — ABNORMAL HIGH (ref 0.3–1.2)
Total Protein: 7 g/dL (ref 6.5–8.1)

## 2021-05-31 LAB — CBC
HCT: 44.5 % (ref 36.0–46.0)
Hemoglobin: 14.6 g/dL (ref 12.0–15.0)
MCH: 30.2 pg (ref 26.0–34.0)
MCHC: 32.8 g/dL (ref 30.0–36.0)
MCV: 92.1 fL (ref 80.0–100.0)
Platelets: 218 10*3/uL (ref 150–400)
RBC: 4.83 MIL/uL (ref 3.87–5.11)
RDW: 12.7 % (ref 11.5–15.5)
WBC: 12.1 10*3/uL — ABNORMAL HIGH (ref 4.0–10.5)
nRBC: 0 % (ref 0.0–0.2)

## 2021-05-31 LAB — I-STAT CHEM 8, ED
BUN: 11 mg/dL (ref 6–20)
Calcium, Ion: 1.15 mmol/L (ref 1.15–1.40)
Chloride: 106 mmol/L (ref 98–111)
Creatinine, Ser: 0.5 mg/dL (ref 0.44–1.00)
Glucose, Bld: 94 mg/dL (ref 70–99)
HCT: 45 % (ref 36.0–46.0)
Hemoglobin: 15.3 g/dL — ABNORMAL HIGH (ref 12.0–15.0)
Potassium: 3.9 mmol/L (ref 3.5–5.1)
Sodium: 139 mmol/L (ref 135–145)
TCO2: 22 mmol/L (ref 22–32)

## 2021-05-31 LAB — I-STAT BETA HCG BLOOD, ED (MC, WL, AP ONLY): I-stat hCG, quantitative: <5 m[IU]/mL (ref <5)

## 2021-05-31 LAB — LACTIC ACID, PLASMA: Lactic Acid, Venous: 1.4 mmol/L (ref 0.5–1.9)

## 2021-05-31 MED ORDER — FENTANYL CITRATE PF 50 MCG/ML IJ SOSY
50.0000 ug | PREFILLED_SYRINGE | Freq: Once | INTRAMUSCULAR | Status: AC
  Administered 2021-05-31: 50 ug via INTRAVENOUS
  Filled 2021-05-31: qty 1

## 2021-05-31 MED ORDER — METHOCARBAMOL 500 MG PO TABS: 500.0000 mg | ORAL_TABLET | Freq: Two times a day (BID) | ORAL | 0 refills | Status: AC | PRN

## 2021-05-31 MED ORDER — METHOCARBAMOL 500 MG PO TABS
500.0000 mg | ORAL_TABLET | Freq: Once | ORAL | Status: AC
  Administered 2021-05-31: 500 mg via ORAL
  Filled 2021-05-31: qty 1

## 2021-05-31 MED ORDER — IBUPROFEN 600 MG PO TABS: 600.0000 mg | ORAL_TABLET | Freq: Four times a day (QID) | ORAL | 0 refills | Status: AC | PRN

## 2021-05-31 MED ORDER — ACETAMINOPHEN 500 MG PO TABS
1000.0000 mg | ORAL_TABLET | Freq: Once | ORAL | Status: AC
  Administered 2021-05-31: 1000 mg via ORAL
  Filled 2021-05-31: qty 2

## 2021-05-31 MED ORDER — IBUPROFEN 400 MG PO TABS
600.0000 mg | ORAL_TABLET | Freq: Once | ORAL | Status: AC
  Administered 2021-05-31: 600 mg via ORAL
  Filled 2021-05-31: qty 1
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
## 2021-05-31 NOTE — Progress Notes (Signed)
Orthopedic Tech Progress Note Patient Details:  Cathy Peters August 27, 1985 [id number]   Ortho Devices Type of Ortho Device: CAM walker Ortho Device/Splint Location: RLE Ortho Device/Splint Interventions: Ordered, Application, Adjustment   Post Interventions Patient Tolerated: Well Instructions Provided: Care of device, Adjustment of device, Poper ambulation with device  [name] [name] 05/31/2021, 6:29 PM
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
## 2024-02-22 ENCOUNTER — Other Ambulatory Visit: Payer: Self-pay | Admitting: Medical Genetics

## 2024-02-27 ENCOUNTER — Other Ambulatory Visit (HOSPITAL_COMMUNITY)

## 2024-04-28 ENCOUNTER — Other Ambulatory Visit: Payer: Self-pay | Admitting: Medical Genetics

## 2024-04-28 DIAGNOSIS — Z006 Encounter for examination for normal comparison and control in clinical research program: Secondary | ICD-10-CM
